# Patient Record
Sex: Female | Born: 2005
Health system: Southern US, Community
[De-identification: ages and names within clinical notes are randomized; demographics above are authoritative.]

## PROBLEM LIST (undated history)

## (undated) DIAGNOSIS — E278 Other specified disorders of adrenal gland: Secondary | ICD-10-CM

## (undated) DIAGNOSIS — R7989 Other specified abnormal findings of blood chemistry: Secondary | ICD-10-CM

## (undated) DIAGNOSIS — L680 Hirsutism: Secondary | ICD-10-CM

## (undated) HISTORY — DX: Hirsutism: L68.0

## (undated) HISTORY — DX: Other specified abnormal findings of blood chemistry: R79.89

## (undated) HISTORY — DX: Other specified disorders of adrenal gland: E27.8

---
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
## 2023-01-22 ENCOUNTER — Ambulatory Visit: Payer: Self-pay | Admitting: Family Medicine

## 2023-04-13 ENCOUNTER — Encounter: Payer: Self-pay | Admitting: Nurse Practitioner

## 2023-04-13 ENCOUNTER — Ambulatory Visit (INDEPENDENT_AMBULATORY_CARE_PROVIDER_SITE_OTHER): Payer: 59 | Admitting: Nurse Practitioner

## 2023-04-13 VITALS — BP 140/82 | HR 90 | Temp 98.7°F | Ht 67.0 in | Wt 198.2 lb

## 2023-04-13 DIAGNOSIS — E559 Vitamin D deficiency, unspecified: Secondary | ICD-10-CM | POA: Diagnosis not present

## 2023-04-13 DIAGNOSIS — R7303 Prediabetes: Secondary | ICD-10-CM

## 2023-04-13 DIAGNOSIS — Z6831 Body mass index (BMI) 31.0-31.9, adult: Secondary | ICD-10-CM

## 2023-04-13 DIAGNOSIS — E66811 Obesity, class 1: Secondary | ICD-10-CM

## 2023-04-13 DIAGNOSIS — E6609 Other obesity due to excess calories: Secondary | ICD-10-CM

## 2023-04-13 DIAGNOSIS — I10 Essential (primary) hypertension: Secondary | ICD-10-CM | POA: Diagnosis not present

## 2023-04-13 MED ORDER — HYDROCHLOROTHIAZIDE 12.5 MG PO CAPS
12.5000 mg | ORAL_CAPSULE | Freq: Every day | ORAL | 1 refills | Status: DC
Start: 2023-04-13 — End: 2023-04-14

## 2023-04-13 NOTE — Assessment & Plan Note (Addendum)
Chronic, blood pressure is elevated today, slightly improved with repeat, she has not taken her medication in 3 months. Reminded to take daily and the risk for long term effects related to poorly controlled, hypertension. Sent a new Rx to the pharmacy as the medications are in her dorm at [employer] [facility]. Continue current medications

## 2023-04-13 NOTE — Assessment & Plan Note (Signed)
Will check vitamin D level and supplement as needed.    Also encouraged to spend 15 minutes in the sun daily.   

## 2023-04-13 NOTE — Progress Notes (Signed)
[name], CMA,acting as a Neurosurgeon for [name], FNP.,have documented all relevant documentation on the behalf of [name], FNP,as directed by  [name], FNP while in the presence of [name], FNP.  Subjective:  Patient ID: Pamela Powers , female    DOB: [birth year] , 17 y.o.   MRN: [id number]  Chief Complaint  Patient presents with   Hypertension    HPI  Patient presents today for a bp and pre dm follow up, Patient reports non- compliance with medication. Patient denies any chest pain, SOB, or headaches. Patient has no concerns today. Patient reports she is not taking her hydrochlorothiazide in about 3 months because she forgot. She has left her medications in her dorm at [facility]     Past Medical History:  Diagnosis Date   Elevated DHEA    Elevated hemoglobin A1c 04/2020   5.8   Hirsutism 2020   elevated testosterone and DHEA-S     Family History  Problem Relation Age of Onset   Hypertension Mother    Hypertension Father    Thyroid disease Father        graves   Diabetes Maternal Grandmother    Hypertension Maternal Grandmother    Hypertension Maternal Grandfather    Diabetes Paternal Grandmother    Hypertension Paternal Grandmother    Diabetes Paternal Grandfather    Hypertension Paternal Grandfather      Current Outpatient Medications:    Magnesium 250 MG TABS, Take 1 tablet (250 mg total) by mouth every evening., Disp: 90 tablet, Rfl: 1   Vitamin D, Ergocalciferol, (DRISDOL) 1.25 MG (50000 UNIT) CAPS capsule, Take 1 capsule (50,000 Units total) by mouth every 7 (seven) days., Disp: 12 capsule, Rfl: 1   hydrochlorothiazide (MICROZIDE) 12.5 MG capsule, Take 1 capsule (12.5 mg total) by mouth daily., Disp: 30 capsule, Rfl: 1   No Known Allergies   Review of Systems  Constitutional: Negative.   Eyes: Negative.   Respiratory: Negative.    Cardiovascular: Negative.  Negative for chest pain and leg swelling.  Musculoskeletal: Negative.    Skin: Negative.   Neurological:  Negative for headaches.  Psychiatric/Behavioral: Negative.       Today's Vitals   04/13/23 0830 04/13/23 0849  BP: (!) 140/90 (!) 140/82  Pulse: 90   Temp: 98.7 F (37.1 C)   TempSrc: Oral   Weight: 198 lb 3.2 oz (89.9 kg)   Height: 5\' 7"  (1.702 m)   PainSc: 0-No pain    Body mass index is 31.04 kg/m.  Wt Readings from Last 3 Encounters:  04/13/23 198 lb 3.2 oz (89.9 kg) (97%, Z= 1.91)*  11/25/22 210 lb 3.2 oz (95.3 kg) (98%, Z= 2.09)*  11/11/22 213 lb (96.6 kg) (98%, Z= 2.12)*   * Growth percentiles are based on CDC (Girls, 2-20 Years) data.     Objective:  Physical Exam Vitals reviewed.  Constitutional:      General: She is not in acute distress.    Appearance: Normal appearance. She is obese.  Cardiovascular:     Rate and Rhythm: Normal rate and regular rhythm.     Pulses: Normal pulses.     Heart sounds: Normal heart sounds. No murmur heard. Pulmonary:     Effort: Pulmonary effort is normal. No respiratory distress.     Breath sounds: Normal breath sounds. No wheezing.  Musculoskeletal:        General: Normal range of motion.  Skin:    General: Skin is warm.  Neurological:  General: No focal deficit present.     Mental Status: She is alert and oriented to person, place, and time.     Cranial Nerves: No cranial nerve deficit.     Motor: No weakness.  Psychiatric:        Mood and Affect: Mood normal.        Behavior: Behavior normal.        Thought Content: Thought content normal.        Judgment: Judgment normal.         Assessment And Plan:  Essential hypertension Assessment & Plan: Chronic, blood pressure is elevated today, slightly improved with repeat, she has not taken her medication in 3 months. Reminded to take daily and the risk for long term effects related to poorly controlled, hypertension. Sent a new Rx to the pharmacy as the medications are in her dorm at [facility]. Continue current  medications  Orders: -     BMP8+eGFR -     hydroCHLOROthiazide; Take 1 capsule (12.5 mg total) by mouth daily.  Dispense: 30 capsule; Refill: 1  Prediabetes Assessment & Plan: HgbA1c has been normal at her last visit. Continue focusing on healthy diet and regular exercise.   Orders: -     Hemoglobin A1c  Class 1 obesity due to excess calories without serious comorbidity with body mass index (BMI) of 34.0 to 34.9 in adult  Vitamin D deficiency Assessment & Plan: Will check vitamin D level and supplement as needed.    Also encouraged to spend 15 minutes in the sun daily.    Orders: -     VITAMIN D 25 Hydroxy (Vit-D Deficiency, Fractures)  Class 1 drug-induced obesity with serious comorbidity and body mass index (BMI) of 31.0 to 31.9 in adult Assessment & Plan: She is encouraged to strive for BMI less than 30 to decrease cardiac risk. Advised to aim for at least 150 minutes of exercise per week. Congratulated on her 12 lb weight loss.       Return for 4 month bp check.  Patient was given opportunity to ask questions. Patient verbalized understanding of the plan and was able to repeat key elements of the plan. All questions were answered to their satisfaction.    Jeanell Sparrow, FNP, have reviewed all documentation for this visit. The documentation on 04/13/23 for the exam, diagnosis, procedures, and orders are all accurate and complete.   IF YOU HAVE BEEN REFERRED TO A SPECIALIST, IT MAY TAKE 1-2 WEEKS TO SCHEDULE/PROCESS THE REFERRAL. IF YOU HAVE NOT HEARD FROM US/SPECIALIST IN TWO WEEKS, PLEASE GIVE [place] A CALL AT [phone].

## 2023-04-13 NOTE — Assessment & Plan Note (Signed)
HgbA1c has been normal at her last visit. Continue focusing on healthy diet and regular exercise.

## 2023-04-13 NOTE — Assessment & Plan Note (Signed)
She is encouraged to strive for BMI less than 30 to decrease cardiac risk. Advised to aim for at least 150 minutes of exercise per week. Congratulated on her 12 lb weight loss.

## 2023-04-13 NOTE — Patient Instructions (Signed)
Congratulations on your 12 lb weight loss. Continue exercising regularly.

## 2023-04-14 ENCOUNTER — Other Ambulatory Visit: Payer: Self-pay

## 2023-04-14 DIAGNOSIS — I10 Essential (primary) hypertension: Secondary | ICD-10-CM

## 2023-04-14 LAB — HEMOGLOBIN A1C
Est. average glucose Bld gHb Est-mCnc: 111 mg/dL
Hgb A1c MFr Bld: 5.5 % (ref 4.8–5.6)

## 2023-04-14 LAB — BMP8+EGFR
BUN/Creatinine Ratio: 15 (ref 9–23)
BUN: 12 mg/dL (ref 6–20)
CO2: 21 mmol/L (ref 20–29)
Calcium: 9.4 mg/dL (ref 8.7–10.2)
Chloride: 103 mmol/L (ref 96–106)
Creatinine, Ser: 0.79 mg/dL (ref 0.57–1.00)
Glucose: 72 mg/dL (ref 70–99)
Potassium: 4.5 mmol/L (ref 3.5–5.2)
Sodium: 140 mmol/L (ref 134–144)
eGFR: 110 mL/min/{1.73_m2} (ref >59)

## 2023-04-14 LAB — VITAMIN D 25 HYDROXY (VIT D DEFICIENCY, FRACTURES): Vit D, 25-Hydroxy: 32.4 ng/mL (ref 30.0–100.0)

## 2023-04-14 MED ORDER — HYDROCHLOROTHIAZIDE 12.5 MG PO CAPS
12.5000 mg | ORAL_CAPSULE | Freq: Every day | ORAL | 1 refills | Status: DC
Start: 2023-04-14 — End: 2023-04-16

## 2023-04-16 ENCOUNTER — Other Ambulatory Visit: Payer: Self-pay

## 2023-04-16 ENCOUNTER — Encounter: Payer: Self-pay | Admitting: Nurse Practitioner

## 2023-04-16 DIAGNOSIS — I10 Essential (primary) hypertension: Secondary | ICD-10-CM

## 2023-04-16 MED ORDER — HYDROCHLOROTHIAZIDE 12.5 MG PO CAPS
12.5000 mg | ORAL_CAPSULE | Freq: Every day | ORAL | 0 refills | Status: DC
Start: 2023-04-16 — End: 2023-11-30

## 2023-04-16 MED ORDER — HYDROCHLOROTHIAZIDE 12.5 MG PO CAPS: 12.5000 mg | ORAL_CAPSULE | Freq: Every day | ORAL | 1 refills | Status: DC

## 2023-04-27 ENCOUNTER — Encounter: Payer: Self-pay | Admitting: Nurse Practitioner

## 2023-04-28 ENCOUNTER — Encounter: Payer: Self-pay | Admitting: Nurse Practitioner

## 2023-05-19 ENCOUNTER — Ambulatory Visit: Payer: 59 | Admitting: Nurse Practitioner

## 2023-05-19 ENCOUNTER — Encounter: Payer: Self-pay | Admitting: Nurse Practitioner

## 2023-05-19 VITALS — BP 130/80 | HR 88 | Temp 98.4°F | Ht 67.0 in | Wt 204.6 lb

## 2023-05-19 DIAGNOSIS — I10 Essential (primary) hypertension: Secondary | ICD-10-CM | POA: Diagnosis not present

## 2023-05-19 DIAGNOSIS — R7989 Other specified abnormal findings of blood chemistry: Secondary | ICD-10-CM

## 2023-05-19 DIAGNOSIS — E6609 Other obesity due to excess calories: Secondary | ICD-10-CM | POA: Diagnosis not present

## 2023-05-19 DIAGNOSIS — Z2821 Immunization not carried out because of patient refusal: Secondary | ICD-10-CM

## 2023-05-19 DIAGNOSIS — E66811 Obesity, class 1: Secondary | ICD-10-CM | POA: Diagnosis not present

## 2023-05-19 DIAGNOSIS — Z30011 Encounter for initial prescription of contraceptive pills: Secondary | ICD-10-CM

## 2023-05-19 DIAGNOSIS — Z6832 Body mass index (BMI) 32.0-32.9, adult: Secondary | ICD-10-CM

## 2023-05-19 MED ORDER — NORETHINDRONE 0.35 MG PO TABS: 1.0000 | ORAL_TABLET | Freq: Every day | ORAL | 11 refills | Status: AC

## 2023-05-19 NOTE — Patient Instructions (Addendum)
Micronor - It is best to begin taking this medication on the first day of your menstrual period. If you begin taking it on any other day, use a backup form of non-hormonal birth control (such as condoms, spermicide) for the first 48 hours to prevent pregnancy until the medication has enough time to work..  Abdominal pain (stomach) pain (severe) Chest Pain (sharp, crushing or heaviness)  Headaches sudden severe or vomiting, dizziness or fainting, weakness or numbness in arm or leg Eye Problems - blurring vision, flashing lights or partial/complete vision loss Sudden leg pain in calf or thigh or redness.

## 2023-05-19 NOTE — Progress Notes (Unsigned)
Pamela Powers, CMA,acting as a Neurosurgeon for Pamela Felts, FNP.,have documented all relevant documentation on the behalf of Pamela Felts, FNP,as directed by  Pamela Felts, FNP while in the presence of Pamela Felts, FNP.  Subjective:  Patient ID: Pamela Powers , female    DOB: 01-29-2006 , 18 y.o.   MRN: [id number]  No chief complaint on file.   HPI  Patient presents today for wanting DHEA levels checked. Patient reports this level was high when she was 12/15. Patient reports she is having more hair growth on her chin. Patient would like to start a birth control again. She stopped taking OCP in 2022. LMP December 29th. Menstrual cycles are regular. Patient reports she was seen by a endocrinologist previously.   Patient is with her mother Pamela Powers.   Wt Readings from Last 3 Encounters: 05/19/23 : 204 lb 9.6 oz (92.8 kg) (98%, Z= 2.01)* 04/13/23 : 198 lb 3.2 oz (89.9 kg) (97%, Z= 1.91)* 11/25/22 : 210 lb 3.2 oz (95.3 kg) (98%, Z= 2.09)*      Past Medical History:  Diagnosis Date   Class 1 obesity due to excess calories with body mass index (BMI) of 33.0 to 33.9 in adult 10/23/2022   Elevated DHEA    Elevated hemoglobin A1c 04/2020   5.8   Hirsutism 2020   elevated testosterone and DHEA-S     Family History  Problem Relation Age of Onset   Hypertension Mother    Hypertension Father    Thyroid disease Father        graves   Diabetes Maternal Grandmother    Hypertension Maternal Grandmother    Hypertension Maternal Grandfather    Diabetes Paternal Grandmother    Hypertension Paternal Grandmother    Diabetes Paternal Grandfather    Hypertension Paternal Grandfather      Current Outpatient Medications:    hydrochlorothiazide (MICROZIDE) 12.5 MG capsule, Take 1 capsule (12.5 mg total) by mouth daily., Disp: 90 capsule, Rfl: 0   Magnesium 250 MG TABS, Take 1 tablet (250 mg total) by mouth every evening., Disp: 90 tablet, Rfl: 1   norethindrone (MICRONOR) 0.35 MG tablet, Take 1  tablet (0.35 mg total) by mouth daily., Disp: 28 tablet, Rfl: 11   Vitamin D, Ergocalciferol, (DRISDOL) 1.25 MG (50000 UNIT) CAPS capsule, Take 1 capsule (50,000 Units total) by mouth every 7 (seven) days., Disp: 12 capsule, Rfl: 1   No Known Allergies   Review of Systems  Constitutional: Negative.   Respiratory: Negative.       Today's Vitals   05/19/23 1413  BP: 130/80  Pulse: 88  Temp: 98.4 F (36.9 C)  TempSrc: Oral  Weight: 204 lb 9.6 oz (92.8 kg)  Height: 5\' 7"  (1.702 m)  PainSc: 0-No pain   Body mass index is 32.04 kg/m.  Wt Readings from Last 3 Encounters:  05/19/23 204 lb 9.6 oz (92.8 kg) (98%, Z= 2.01)*  04/13/23 198 lb 3.2 oz (89.9 kg) (97%, Z= 1.91)*  11/25/22 210 lb 3.2 oz (95.3 kg) (98%, Z= 2.09)*   * Growth percentiles are based on CDC (Girls, 2-20 Years) data.   Objective:  Physical Exam Vitals reviewed.  Constitutional:      General: She is not in acute distress.    Appearance: Normal appearance. She is obese.  Cardiovascular:     Rate and Rhythm: Normal rate and regular rhythm.     Pulses: Normal pulses.     Heart sounds: Normal heart sounds. No murmur heard. Pulmonary:  Effort: Pulmonary effort is normal. No respiratory distress.     Breath sounds: Normal breath sounds. No wheezing.  Musculoskeletal:        General: Normal range of motion.  Skin:    General: Skin is warm.  Neurological:     General: No focal deficit present.     Mental Status: She is alert and oriented to person, place, and time.     Cranial Nerves: No cranial nerve deficit.     Motor: No weakness.  Psychiatric:        Mood and Affect: Mood normal.        Behavior: Behavior normal.        Thought Content: Thought content normal.        Judgment: Judgment normal.         Assessment And Plan:  Elevated DHEA Assessment & Plan: She has had an elevated DHEA in the past and seen by a pediatric endocrinologist would now like to see an adult Endocrinologist to see if any  additional testing needs to be done.   Orders: -     POCT urine pregnancy -     Ambulatory referral to Endocrinology  Essential hypertension Assessment & Plan: Blood pressure is controlled, continue current medications  Orders: -     Norethindrone; Take 1 tablet (0.35 mg total) by mouth daily.  Dispense: 28 tablet; Refill: 11  COVID-19 vaccination declined Assessment & Plan: Declines covid 19 vaccine. Discussed risk of covid 58 and if she changes her mind about the vaccine to call the office. Education has been provided regarding the importance of this vaccine but patient still declined. Advised may receive this vaccine at local pharmacy or Health Dept.or vaccine clinic. Aware to provide a copy of the vaccination record if obtained from local pharmacy or Health Dept.  Encouraged to take multivitamin, vitamin d, vitamin c and zinc to increase immune system. Aware can call office if would like to have vaccine here at office. Verbalized acceptance and understanding.    Class 1 obesity due to excess calories with body mass index (BMI) of 32.0 to 32.9 in adult, unspecified whether serious comorbidity present Assessment & Plan: She is encouraged to strive for BMI less than 30 to decrease cardiac risk. Advised to aim for at least 150 minutes of exercise per week.    Encounter for oral contraception initial prescription Assessment & Plan: Will start her on progesterone only birth control due to history of hypertension. Negative urine pregnancy  Orders: -     Norethindrone; Take 1 tablet (0.35 mg total) by mouth daily.  Dispense: 28 tablet; Refill: 11    Return for reschedule her May appt for when she is out of school .  Patient was given opportunity to ask questions. Patient verbalized understanding of the plan and was able to repeat key elements of the plan. All questions were answered to their satisfaction.   Pamela Sparrow, FNP, have reviewed all documentation for this visit. The  documentation on 05/19/23 for the exam, diagnosis, procedures, and orders are all accurate and complete.    IF YOU HAVE BEEN REFERRED TO A SPECIALIST, IT MAY TAKE 1-2 WEEKS TO SCHEDULE/PROCESS THE REFERRAL. IF YOU HAVE NOT HEARD FROM US/SPECIALIST IN TWO WEEKS, PLEASE GIVE [place] A CALL AT [phone].

## 2023-05-27 DIAGNOSIS — Z2821 Immunization not carried out because of patient refusal: Secondary | ICD-10-CM | POA: Insufficient documentation

## 2023-05-27 DIAGNOSIS — Z30011 Encounter for initial prescription of contraceptive pills: Secondary | ICD-10-CM | POA: Insufficient documentation

## 2023-05-27 DIAGNOSIS — E66811 Obesity, class 1: Secondary | ICD-10-CM | POA: Insufficient documentation

## 2023-05-27 NOTE — Assessment & Plan Note (Signed)

## 2023-05-27 NOTE — Assessment & Plan Note (Addendum)
Will start her on progesterone only birth control due to history of hypertension. Negative urine pregnancy

## 2023-05-27 NOTE — Assessment & Plan Note (Signed)
Blood pressure is controlled, continue current medications

## 2023-05-27 NOTE — Assessment & Plan Note (Signed)
She has had an elevated DHEA in the past and seen by a pediatric endocrinologist would now like to see an adult Endocrinologist to see if any additional testing needs to be done.

## 2023-05-27 NOTE — Assessment & Plan Note (Signed)
She is encouraged to strive for BMI less than 30 to decrease cardiac risk. Advised to aim for at least 150 minutes of exercise per week.

## 2023-05-28 LAB — POCT URINE PREGNANCY: Preg Test, Ur: NEGATIVE

## 2023-06-01 ENCOUNTER — Encounter: Payer: Self-pay | Admitting: Nurse Practitioner

## 2023-08-31 ENCOUNTER — Ambulatory Visit: Payer: 59 | Admitting: Nurse Practitioner

## 2023-09-07 ENCOUNTER — Ambulatory Visit (INDEPENDENT_AMBULATORY_CARE_PROVIDER_SITE_OTHER): Payer: 59 | Admitting: Nurse Practitioner

## 2023-09-07 ENCOUNTER — Encounter: Payer: Self-pay | Admitting: Nurse Practitioner

## 2023-09-07 VITALS — BP 130/96 | HR 85 | Temp 98.5°F | Ht 67.0 in | Wt 197.4 lb

## 2023-09-07 DIAGNOSIS — R7303 Prediabetes: Secondary | ICD-10-CM

## 2023-09-07 DIAGNOSIS — E6609 Other obesity due to excess calories: Secondary | ICD-10-CM

## 2023-09-07 DIAGNOSIS — E66811 Obesity, class 1: Secondary | ICD-10-CM | POA: Insufficient documentation

## 2023-09-07 DIAGNOSIS — Z6830 Body mass index (BMI) 30.0-30.9, adult: Secondary | ICD-10-CM

## 2023-09-07 DIAGNOSIS — I10 Essential (primary) hypertension: Secondary | ICD-10-CM | POA: Diagnosis not present

## 2023-09-07 DIAGNOSIS — Z2821 Immunization not carried out because of patient refusal: Secondary | ICD-10-CM

## 2023-09-07 MED ORDER — AMLODIPINE BESYLATE 2.5 MG PO TABS: 2.5000 mg | ORAL_TABLET | Freq: Every day | ORAL | 2 refills | Status: AC

## 2023-09-07 NOTE — Assessment & Plan Note (Signed)

## 2023-09-07 NOTE — Patient Instructions (Signed)
Congratulations on your weight loss, keep up the good work.

## 2023-09-07 NOTE — Progress Notes (Signed)
 [place], CMA,acting as a Neurosurgeon for Susanna Epley, FNP.,have documented all relevant documentation on the behalf of Susanna Epley, FNP,as directed by  Susanna Epley, FNP while in the presence of Susanna Epley, FNP.  Subjective:  Patient ID: Pamela Powers , female    DOB: 2005-05-07 , 18 y.o.   MRN: [id number]  Chief Complaint  Patient presents with   Hypertension    Patient presents today for a bp and pre dm follow up, Patient reports compliance with medication. Patient denies any chest pain, SOB, or headaches. Patient has no concerns today.     HPI  She feels like she may have missed two doses of her blood pressure medications last week. 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 Encouraged to make sure she stays well hydrated with water when working this summer      Past Medical History:  Diagnosis Date   Class 1 obesity due to excess calories with body mass index (BMI) of 33.0 to 33.9 in adult 10/23/2022   Elevated DHEA    Elevated hemoglobin A1c 04/2020   5.8   Hirsutism 2020   elevated testosterone  and DHEA-S     Family History  Problem Relation Age of Onset   Hypertension Mother    Hypertension Father    Thyroid  disease Father        graves   Diabetes Maternal Grandmother    Hypertension Maternal Grandmother    Hypertension Maternal Grandfather    Diabetes Paternal Grandmother    Hypertension Paternal Grandmother    Diabetes Paternal Grandfather    Hypertension Paternal Grandfather      Current Outpatient Medications:    amLODipine  (NORVASC ) 2.5 MG tablet, Take 1 tablet (2.5 mg total) by mouth daily., Disp: 90 tablet, Rfl: 2   hydrochlorothiazide  (MICROZIDE ) 12.5 MG capsule, Take 1 capsule (12.5 mg total) by mouth daily., Disp: 90 capsule, Rfl: 0   Magnesium  250 MG TABS, Take 1 tablet (250 mg total) by mouth every evening., Disp: 90 tablet, Rfl: 1   Vitamin D , Ergocalciferol , (DRISDOL ) 1.25 MG (50000 UNIT) CAPS capsule, Take 1 capsule  (50,000 Units total) by mouth every 7 (seven) days., Disp: 12 capsule, Rfl: 1   norethindrone  (MICRONOR ) 0.35 MG tablet, Take 1 tablet (0.35 mg total) by mouth daily. (Patient not taking: Reported on 09/07/2023), Disp: 28 tablet, Rfl: 11   No Known Allergies   Review of Systems  Constitutional: Negative.   Respiratory: Negative.    Cardiovascular: Negative.   Neurological: Negative.   Psychiatric/Behavioral: Negative.       Today's Vitals   09/07/23 1450 09/07/23 1510  BP: (!) 140/100 (!) 130/96  Pulse: 85   Temp: 98.5 F (36.9 C)   TempSrc: Oral   Weight: 197 lb 6.4 oz (89.5 kg)   Height: 5\' 7"  (1.702 m)   PainSc: 0-No pain    Body mass index is 30.92 kg/m.  Wt Readings from Last 3 Encounters:  09/07/23 197 lb 6.4 oz (89.5 kg)  05/19/23 204 lb 9.6 oz (92.8 kg) (98%, Z= 2.01)*  04/13/23 198 lb 3.2 oz (89.9 kg) (97%, Z= 1.91)*   * Growth percentiles are based on CDC (Girls, 2-20 Years) data.     Objective:  Physical Exam Vitals and nursing note reviewed.  Constitutional:      General: She is not in acute distress.    Appearance: Normal appearance. She is obese.  Cardiovascular:     Rate and Rhythm: Normal  rate and regular rhythm.     Pulses: Normal pulses.     Heart sounds: Normal heart sounds. No murmur heard. Pulmonary:     Effort: Pulmonary effort is normal. No respiratory distress.     Breath sounds: Normal breath sounds. No wheezing.  Musculoskeletal:        General: Normal range of motion.  Skin:    General: Skin is warm.  Neurological:     General: No focal deficit present.     Mental Status: She is alert and oriented to person, place, and time.     Cranial Nerves: No cranial nerve deficit.     Motor: No weakness.  Psychiatric:        Mood and Affect: Mood normal.        Behavior: Behavior normal.        Thought Content: Thought content normal.        Judgment: Judgment normal.         Assessment And Plan:  Poorly-controlled  hypertension Assessment & Plan: Blood pressure is elevated with hydrochlorothiazide  at 12.5 mg daily. Will add amlodipine  2.5 mg daily in the evening. This hopefully will be short term of having both medications. I am also checking a renal stenosis ultrasound due to poorly controlled hypertension and her age.   Orders: -     BMP8+eGFR -     amLODIPine  Besylate; Take 1 tablet (2.5 mg total) by mouth daily.  Dispense: 90 tablet; Refill: 2 -     US  RENAL ARTERY DUPLEX COMPLETE; Future -     TSH  Prediabetes Assessment & Plan: HgbA1c has been normal at her last visit. Continue focusing on healthy diet and regular exercise.   Orders: -     Hemoglobin A1c  COVID-19 vaccination declined Assessment & Plan: Declines covid 19 vaccine. Discussed risk of covid 82 and if she changes her mind about the vaccine to call the office. Education has been provided regarding the importance of this vaccine but patient still declined. Advised may receive this vaccine at local pharmacy or Health Dept.or vaccine clinic. Aware to provide a copy of the vaccination record if obtained from local pharmacy or Health Dept.  Encouraged to take multivitamin, vitamin d , vitamin c and zinc to increase immune system. Aware can call office if would like to have vaccine here at office. Verbalized acceptance and understanding.    Class 1 obesity due to excess calories with body mass index (BMI) of 30.0 to 30.9 in adult, unspecified whether serious comorbidity present Assessment & Plan: Congratulated her on her 7 lb weight loss, encouraged to continue focusing on healthy diet and regular exercise.     Return for keep same next..  Patient was given opportunity to ask questions. Patient verbalized understanding of the plan and was able to repeat key elements of the plan. All questions were answered to their satisfaction.    Inge Mangle, FNP, have reviewed all documentation for this visit. The documentation on 09/07/23 for  the exam, diagnosis, procedures, and orders are all accurate and complete.   IF YOU HAVE BEEN REFERRED TO A SPECIALIST, IT MAY TAKE 1-2 WEEKS TO SCHEDULE/PROCESS THE REFERRAL. IF YOU HAVE NOT HEARD FROM US /SPECIALIST IN TWO WEEKS, PLEASE GIVE US  A CALL AT [phone].

## 2023-09-07 NOTE — Assessment & Plan Note (Signed)
 Blood pressure is elevated with hydrochlorothiazide  at 12.5 mg daily. Will add amlodipine  2.5 mg daily in the evening. This hopefully will be short term of having both medications. I am also checking a renal stenosis ultrasound due to poorly controlled hypertension and her age.

## 2023-09-07 NOTE — Assessment & Plan Note (Signed)
 HgbA1c has been normal at her last visit. Continue focusing on healthy diet and regular exercise.

## 2023-09-07 NOTE — Assessment & Plan Note (Signed)
 Congratulated her on her 7 lb weight loss, encouraged to continue focusing on healthy diet and regular exercise.

## 2023-09-08 ENCOUNTER — Ambulatory Visit: Payer: Self-pay | Admitting: Nurse Practitioner

## 2023-09-08 LAB — BMP8+EGFR
BUN/Creatinine Ratio: 23 (ref 9–23)
BUN: 17 mg/dL (ref 6–20)
CO2: 20 mmol/L (ref 20–29)
Calcium: 9.7 mg/dL (ref 8.7–10.2)
Chloride: 101 mmol/L (ref 96–106)
Creatinine, Ser: 0.73 mg/dL (ref 0.57–1.00)
Glucose: 77 mg/dL (ref 70–99)
Potassium: 4.2 mmol/L (ref 3.5–5.2)
Sodium: 138 mmol/L (ref 134–144)
eGFR: 121 mL/min/{1.73_m2} (ref >59)

## 2023-09-08 LAB — TSH: TSH: 0.709 u[IU]/mL (ref 0.450–4.500)

## 2023-09-08 LAB — HEMOGLOBIN A1C
Est. average glucose Bld gHb Est-mCnc: 111 mg/dL
Hgb A1c MFr Bld: 5.5 % (ref 4.8–5.6)

## 2023-09-16 ENCOUNTER — Ambulatory Visit
Admission: RE | Admit: 2023-09-16 | Discharge: 2023-09-16 | Disposition: A | Discharge status: Home / Self Care | Source: Ambulatory Visit | Attending: Nurse Practitioner | Admitting: Nurse Practitioner

## 2023-09-16 DIAGNOSIS — I10 Essential (primary) hypertension: Secondary | ICD-10-CM

## 2023-10-05 ENCOUNTER — Ambulatory Visit (INDEPENDENT_AMBULATORY_CARE_PROVIDER_SITE_OTHER): Admitting: "Endocrinology

## 2023-10-05 ENCOUNTER — Encounter: Payer: Self-pay | Admitting: "Endocrinology

## 2023-10-05 VITALS — BP 134/80 | HR 95 | Ht 67.0 in | Wt 201.0 lb

## 2023-10-05 DIAGNOSIS — R7989 Other specified abnormal findings of blood chemistry: Secondary | ICD-10-CM

## 2023-10-05 DIAGNOSIS — L680 Hirsutism: Secondary | ICD-10-CM | POA: Diagnosis not present

## 2023-10-05 NOTE — Progress Notes (Signed)
 Outpatient Endocrinology Note [name], MD    Arne Bevel 2006/04/25 [id number]  Referring Provider: Susanna Epley, FNP Primary Care Provider: Susanna Epley, FNP Reason for consultation: Subjective   Assessment & Plan  Diagnoses and all orders for this visit:  High serum dehydroepiandrosterone (DHEA) -     DHEA-sulfate -     Testosterone  Total,Free,Bio, Males -     Lipid panel -     Cortisol  Hirsutism  Patient presents for evaluation of history of elevated DHEA sulfate with c/o chin hair requiring waxing/shaving Thick hair noted on the chin, bilateral thighs anteriorly/medially, below the umbilicus Baseline initial blood work between 2020-2022 reported high consistently high DHEA-sulfate, highest being 533 in 2020, with unremarkable 17 hydroxyprogesterone, androstenedione, mildly elevated total testosterone  with free within the normal range, LH more than FSH, and cortisol of 6.6, although was not done in 8 AM Patient does has right stretch marks around the waist as well as fuller supraclavicular fat pads 10/08/2018 CT ABDOMEN WITHOUT CONTRAST done by pediatric endocrinologist for hirsutism, evaluate for adrenal mass reported adrenal glands within normal limits. Ordered labs, patient open to spironolactone if needed Recommended sticking to waxing rather than shaving for chin hait   Return in about 3 months (around 01/05/2024) for visit and 8 am labs before next visit.   I have reviewed current medications, nurse's notes, allergies, vital signs, past medical and surgical history, family medical history, and social history for this encounter. Counseled patient on symptoms, examination findings, lab findings, imaging results, treatment decisions and monitoring and prognosis. The patient understood the recommendations and agrees with the treatment plan. All questions regarding treatment plan were fully answered.  [name], MD  10/05/23   History of Present  Illness HPI  Pamela Powers is a 18 y.o. year old female who presents for evaluation of history of elevated DHEA sulfate.  Seen by pediatric endocrinologist in the past, who did baseline blood work as well as ultrasound adrenal Reports history of elevated DHEA-S and coming here to check the levels Reports history of excess hair growth on chin since age 18 and was checked on DHEA-S testosterone  Menstrual cycle started at age 18 and has been regular since Takes care of chin hair by waxing and shaving  Component     Latest Ref Rng 06/14/2018 09/22/2018 05/16/2020  LH     mIU/mL 9.1     FSH     mIU/mL 5.3     Testosterone , Total, LC/MS     ng/dL 30.8     Testosterone  Free     Not Estab. pg/mL 8.3     DHEA-SO4     37 - 307 mcg/dL 657.8 (H)  469 (H)  629 (H)   17 HYDROXYPROGESTERONE     ng/dL 47     Cortisol     ug/dL 6.6     ANDROSTENEDIONE     46 - 238 ng/dL  85      52/84/1324 CT ABDOMEN WITHOUT CONTRAST  CLINICAL DATA: Hirsutism, evaluate for adrenal mass  Adrenals/Urinary Tract: Adrenal glands are within normal limits.   Physical Exam  BP 134/80   Pulse 95   Ht 5\' 7"  (1.702 m)   Wt 201 lb (91.2 kg)   LMP 08/14/2023   SpO2 99%   BMI 31.48 kg/m    Constitutional: well developed, well nourished, thick hair noted on the chin, bilateral thighs anteriorly/medially, below the umbilicus, white stretch marks noted around the waist, + supraclavicular fat pads  Head: normocephalic, atraumatic Eyes: sclera anicteric, no redness Neck: supple Lungs: normal respiratory effort Neurology: alert and oriented Skin: dry, no appreciable rashes Musculoskeletal: no appreciable defects Psychiatric: normal mood and affect   Current Medications Patient's Medications  New Prescriptions   No medications on file  Previous Medications   AMLODIPINE  (NORVASC ) 2.5 MG TABLET    Take 1 tablet (2.5 mg total) by mouth daily.   HYDROCHLOROTHIAZIDE  (MICROZIDE ) 12.5 MG CAPSULE    Take 1 capsule (12.5 mg  total) by mouth daily.   MAGNESIUM  250 MG TABS    Take 1 tablet (250 mg total) by mouth every evening.   NORETHINDRONE  (MICRONOR ) 0.35 MG TABLET    Take 1 tablet (0.35 mg total) by mouth daily.   VITAMIN D , ERGOCALCIFEROL , (DRISDOL ) 1.25 MG (50000 UNIT) CAPS CAPSULE    Take 1 capsule (50,000 Units total) by mouth every 7 (seven) days.  Modified Medications   No medications on file  Discontinued Medications   No medications on file    Allergies No Known Allergies  Past Medical History Past Medical History:  Diagnosis Date   Class 1 obesity due to excess calories with body mass index (BMI) of 33.0 to 33.9 in adult 10/23/2022   Elevated DHEA    Elevated hemoglobin A1c 04/2020   5.8   Hirsutism 2020   elevated testosterone  and DHEA-S    Past Surgical History History reviewed. No pertinent surgical history.  Family History family history includes Diabetes in her maternal grandmother, paternal grandfather, and paternal grandmother; Hypertension in her father, maternal grandfather, maternal grandmother, mother, paternal grandfather, and paternal grandmother; Thyroid  disease in her father.  Social History Social History   Socioeconomic History   Marital status: Single    Spouse name: Not on file   Number of children: Not on file   Years of education: Not on file   Highest education level: GED or equivalent  Occupational History   Not on file  Tobacco Use   Smoking status: Never   Smokeless tobacco: Never  Vaping Use   Vaping status: Never Used  Substance and Sexual Activity   Alcohol use: Not Currently   Drug use: Never   Sexual activity: Not on file  Other Topics Concern   Not on file  Social History Narrative   Lives at home with mom, and dad, attends [employer]    Social Drivers of Health   Financial Resource Strain: Low Risk  (04/09/2023)   Overall Financial Resource Strain (CARDIA)    Difficulty of Paying Living Expenses: Not hard at all  Food Insecurity: No  Food Insecurity (04/09/2023)   Hunger Vital Sign    Worried About Running Out of Food in the Last Year: Never true    Ran Out of Food in the Last Year: Never true  Transportation Needs: No Transportation Needs (04/09/2023)   PRAPARE - Administrator, Civil Service (Medical): No    Lack of Transportation (Non-Medical): No  Physical Activity: Sufficiently Active (04/09/2023)   Exercise Vital Sign    Days of Exercise per Week: 4 days    Minutes of Exercise per Session: 40 min  Stress: No Stress Concern Present (04/09/2023)   [employer] of Occupational Health - Occupational Stress Questionnaire    Feeling of Stress : Not at all  Social Connections: Moderately Isolated (04/09/2023)   Social Connection and Isolation Panel [NHANES]    Frequency of Communication with Friends and Family: Once a week    Frequency of  Social Gatherings with Friends and Family: More than three times a week    Attends Religious Services: Never    Database administrator or Organizations: Yes    Attends Banker Meetings: More than 4 times per year    Marital Status: Never married  Intimate Partner Violence: Not on file    Lab Results  Component Value Date   CHOL 142 11/25/2022   Lab Results  Component Value Date   HDL 38 (L) 11/25/2022   Lab Results  Component Value Date   LDLCALC 87 11/25/2022   Lab Results  Component Value Date   TRIG 90 (H) 11/25/2022   Lab Results  Component Value Date   CHOLHDL 3.7 11/25/2022   Lab Results  Component Value Date   CREATININE 0.73 09/07/2023   No results found for: "GFR"    Component Value Date/Time   NA 138 09/07/2023 1515   K 4.2 09/07/2023 1515   CL 101 09/07/2023 1515   CO2 20 09/07/2023 1515   GLUCOSE 77 09/07/2023 1515   BUN 17 09/07/2023 1515   CREATININE 0.73 09/07/2023 1515   CALCIUM 9.7 09/07/2023 1515   PROT 7.1 10/23/2022 0940   ALBUMIN 4.3 10/23/2022 0940   AST 10 10/23/2022 0940   ALT 9 10/23/2022 0940    ALKPHOS 105 10/23/2022 0940   BILITOT 0.8 10/23/2022 0940      Latest Ref Rng & Units 09/07/2023    3:15 PM 04/13/2023   10:43 AM 11/11/2022    8:52 AM  BMP  Glucose 70 - 99 mg/dL 77  72  84   BUN 6 - 20 mg/dL 17  12  16    Creatinine 0.57 - 1.00 mg/dL 6.21  3.08  6.57   BUN/Creat Ratio 9 - 23 23  15  21    Sodium 134 - 144 mmol/L 138  140  139   Potassium 3.5 - 5.2 mmol/L 4.2  4.5  4.8   Chloride 96 - 106 mmol/L 101  103  103   CO2 20 - 29 mmol/L 20  21  22    Calcium 8.7 - 10.2 mg/dL 9.7  9.4  9.8        Component Value Date/Time   WBC 6.3 11/25/2022 1034   RBC 4.25 11/25/2022 1034   HGB 11.5 11/25/2022 1034   HCT 35.0 11/25/2022 1034   PLT 300 11/25/2022 1034   MCV 82 11/25/2022 1034   MCH 27.1 11/25/2022 1034   MCHC 32.9 11/25/2022 1034   RDW 13.7 11/25/2022 1034   Lab Results  Component Value Date   TSH 0.709 09/07/2023   TSH 0.901 07/11/2022         Parts of this note may have been dictated using voice recognition software. There may be variances in spelling and vocabulary which are unintentional. Not all errors are proofread. Please notify the [facility] if any discrepancies are noted or if the meaning of any statement is not clear.

## 2023-10-06 ENCOUNTER — Ambulatory Visit: Payer: Self-pay

## 2023-10-06 VITALS — BP 120/80 | HR 82 | Temp 98.5°F | Ht 67.0 in | Wt 201.0 lb

## 2023-10-06 DIAGNOSIS — I10 Essential (primary) hypertension: Secondary | ICD-10-CM

## 2023-10-06 NOTE — Progress Notes (Signed)
 Patient is in office today for a nurse visit for Blood Pressure Check. Patient currently taking amLODipine  2.5mg  PM and Magnesium  250mg  PM. Patient blood pressure was 120 /80, Patient No chest pain, No shortness of breath, No dyspnea on exertion, No orthopnea, No paroxysmal nocturnal dyspnea, No edema, No palpitations, No syncope BP Readings from Last 3 Encounters:  10/06/23 120/80  10/05/23 134/80  09/07/23 (!) 130/96    Per provider- looks good continue with current medications, follow up next visit.

## 2023-10-08 LAB — CORTISOL: Cortisol, Plasma: 7.7 ug/dL

## 2023-10-08 LAB — CP TESTOSTERONE, BIO-FEMALE/CHILDREN
Albumin: 4.4 g/dL (ref 3.6–5.1)
Sex Hormone Binding: 30 nmol/L (ref 17–124)
TESTOSTERONE, BIOAVAILABLE: 6.8 ng/dL (ref 0.5–8.5)
Testosterone, Free: 3.4 pg/mL (ref 0.2–5.0)
Testosterone, Total, LC-MS-MS: 27 ng/dL (ref 2–45)

## 2023-10-08 LAB — LIPID PANEL
Cholesterol: 155 mg/dL (ref <200)
HDL: 51 mg/dL (ref >=50)
LDL Cholesterol (Calc): 90 mg/dL
Non-HDL Cholesterol (Calc): 104 mg/dL (ref <130)
Total CHOL/HDL Ratio: 3 (calc) (ref <5.0)
Triglycerides: 48 mg/dL (ref <150)

## 2023-10-08 LAB — DHEA-SULFATE: DHEA-SO4: 422 ug/dL — ABNORMAL HIGH (ref 44–286)

## 2023-11-23 ENCOUNTER — Encounter: Payer: Self-pay | Admitting: "Endocrinology

## 2023-11-23 ENCOUNTER — Ambulatory Visit (INDEPENDENT_AMBULATORY_CARE_PROVIDER_SITE_OTHER): Admitting: "Endocrinology

## 2023-11-23 VITALS — BP 124/80 | HR 101 | Ht 67.0 in | Wt 212.0 lb

## 2023-11-23 DIAGNOSIS — L680 Hirsutism: Secondary | ICD-10-CM

## 2023-11-23 DIAGNOSIS — R7989 Other specified abnormal findings of blood chemistry: Secondary | ICD-10-CM | POA: Diagnosis not present

## 2023-11-23 MED ORDER — DEXAMETHASONE 1 MG PO TABS: 1.0000 mg | ORAL_TABLET | Freq: Once | ORAL | 0 refills | Status: AC

## 2023-11-23 NOTE — Progress Notes (Signed)
 Outpatient Endocrinology Note Obadiah Birmingham, MD    Pamela Powers 15-Nov-2005 [id number]  Referring Provider: Georgina Speaks, FNP Primary Care Provider: Georgina Speaks, FNP Reason for consultation: Subjective   Assessment & Plan  Diagnoses and all orders for this visit:  High serum dehydroepiandrosterone (DHEA)  Hirsutism   Patient presents for evaluation of history of elevated DHEA sulfate with c/o chin hair requiring waxing/shaving Thick hair noted on the chin, bilateral thighs anteriorly/medially, below the umbilicus Baseline initial blood work between 2020-2022 reported high consistently high DHEA-sulfate, highest being 533 in 2020, with unremarkable 17 hydroxyprogesterone, androstenedione, mildly elevated total testosterone  with free within the normal range, LH more than FSH, and cortisol of 6.6, although was not done in 8 AM Patient does has right stretch marks around the waist as well as fuller supraclavicular fat pads 10/08/2018 CT ABDOMEN WITHOUT CONTRAST done by pediatric endocrinologist for hirsutism, evaluate for adrenal mass reported adrenal glands within normal limits. Ordered labs, testosterone  WNL so spironolactone is not an option Recommended sticking to waxing rather than shaving for chin hait  No follow-ups on file.   I have reviewed current medications, nurse's notes, allergies, vital signs, past medical and surgical history, family medical history, and social history for this encounter. Counseled patient on symptoms, examination findings, lab findings, imaging results, treatment decisions and monitoring and prognosis. The patient understood the recommendations and agrees with the treatment plan. All questions regarding treatment plan were fully answered.  Obadiah Birmingham, MD  11/23/23   History of Present Illness HPI  Pamela Powers is a 18 y.o. year old female who presents for evaluation of history of elevated DHEA sulfate.  No new complaints  Reports good  energy, no unintentional weight loss/nausea/vomiting/abdominal pain    Seen by pediatric endocrinologist in the past, who did baseline blood work as well as ultrasound adrenal History of elevated DHEA-S - continue monitoring  Reports history of excess hair growth on chin since age 18 and was checked on DHEA-S testosterone  Menstrual cycle started at age 16 and has been regular since Takes care of chin hair by waxing and shaving  Component     Latest Ref Rng 06/14/2018 09/22/2018 05/16/2020  LH     mIU/mL 9.1     FSH     mIU/mL 5.3     Testosterone , Total, LC/MS     ng/dL 52.7     Testosterone  Free     Not Estab. pg/mL 8.3     DHEA-SO4     37 - 307 mcg/dL 466.4 (H)  664 (H)  592 (H)   17 HYDROXYPROGESTERONE     ng/dL 47     Cortisol     ug/dL 6.6     ANDROSTENEDIONE     46 - 238 ng/dL  85      93/87/7979 CT ABDOMEN WITHOUT CONTRAST  CLINICAL DATA: Hirsutism, evaluate for adrenal mass  Adrenals/Urinary Tract: Adrenal glands are within normal limits.   Physical Exam  BP 124/80   Pulse (!) 101   Ht 5' 7 (1.702 m)   Wt 212 lb (96.2 kg)   SpO2 98%   BMI 33.20 kg/m    Constitutional: well developed, well nourished, thick hair noted on the chin, bilateral thighs anteriorly/medially, below the umbilicus, white stretch marks noted around the waist, + supraclavicular fat pads Head: normocephalic, atraumatic Eyes: sclera anicteric, no redness Neck: supple Lungs: normal respiratory effort Neurology: alert and oriented Skin: dry, no appreciable rashes Musculoskeletal: no appreciable defects Psychiatric: normal mood  and affect   Current Medications Patient's Medications  New Prescriptions   No medications on file  Previous Medications   AMLODIPINE  (NORVASC ) 2.5 MG TABLET    Take 1 tablet (2.5 mg total) by mouth daily.   HYDROCHLOROTHIAZIDE  (MICROZIDE ) 12.5 MG CAPSULE    Take 1 capsule (12.5 mg total) by mouth daily.   MAGNESIUM  250 MG TABS    Take 1 tablet (250 mg total) by  mouth every evening.   NORETHINDRONE  (MICRONOR ) 0.35 MG TABLET    Take 1 tablet (0.35 mg total) by mouth daily.   VITAMIN D , ERGOCALCIFEROL , (DRISDOL ) 1.25 MG (50000 UNIT) CAPS CAPSULE    Take 1 capsule (50,000 Units total) by mouth every 7 (seven) days.  Modified Medications   No medications on file  Discontinued Medications   No medications on file    Allergies No Known Allergies  Past Medical History Past Medical History:  Diagnosis Date   Class 1 obesity due to excess calories with body mass index (BMI) of 33.0 to 33.9 in adult 10/23/2022   Elevated DHEA    Elevated hemoglobin A1c 04/2020   5.8   Hirsutism 2020   elevated testosterone  and DHEA-S    Past Surgical History History reviewed. No pertinent surgical history.  Family History family history includes Diabetes in her maternal grandmother, paternal grandfather, and paternal grandmother; Hypertension in her father, maternal grandfather, maternal grandmother, mother, paternal grandfather, and paternal grandmother; Thyroid  disease in her father.  Social History Social History   Socioeconomic History   Marital status: Single    Spouse name: Not on file   Number of children: Not on file   Years of education: Not on file   Highest education level: GED or equivalent  Occupational History   Not on file  Tobacco Use   Smoking status: Never   Smokeless tobacco: Never  Vaping Use   Vaping status: Never Used  Substance and Sexual Activity   Alcohol use: Not Currently   Drug use: Never   Sexual activity: Not on file  Other Topics Concern   Not on file  Social History Narrative   Lives at home with mom, and dad, attends [employer]    Social Drivers of Health   Financial Resource Strain: Low Risk  (04/09/2023)   Overall Financial Resource Strain (CARDIA)    Difficulty of Paying Living Expenses: Not hard at all  Food Insecurity: No Food Insecurity (04/09/2023)   Hunger Vital Sign    Worried About Running Out of  Food in the Last Year: Never true    Ran Out of Food in the Last Year: Never true  Transportation Needs: No Transportation Needs (04/09/2023)   PRAPARE - Administrator, Civil Service (Medical): No    Lack of Transportation (Non-Medical): No  Physical Activity: Sufficiently Active (04/09/2023)   Exercise Vital Sign    Days of Exercise per Week: 4 days    Minutes of Exercise per Session: 40 min  Stress: No Stress Concern Present (04/09/2023)   [employer] of Occupational Health - Occupational Stress Questionnaire    Feeling of Stress : Not at all  Social Connections: Moderately Isolated (04/09/2023)   Social Connection and Isolation Panel    Frequency of Communication with Friends and Family: Once a week    Frequency of Social Gatherings with Friends and Family: More than three times a week    Attends Religious Services: Never    Database administrator or Organizations: Yes  Attends Club or Organization Meetings: More than 4 times per year    Marital Status: Never married  Intimate Partner Violence: Not on file    Lab Results  Component Value Date   CHOL 155 10/05/2023   Lab Results  Component Value Date   HDL 51 10/05/2023   Lab Results  Component Value Date   LDLCALC 90 10/05/2023   Lab Results  Component Value Date   TRIG 48 10/05/2023   Lab Results  Component Value Date   CHOLHDL 3.0 10/05/2023   Lab Results  Component Value Date   CREATININE 0.73 09/07/2023   No results found for: GFR    Component Value Date/Time   NA 138 09/07/2023 1515   K 4.2 09/07/2023 1515   CL 101 09/07/2023 1515   CO2 20 09/07/2023 1515   GLUCOSE 77 09/07/2023 1515   BUN 17 09/07/2023 1515   CREATININE 0.73 09/07/2023 1515   CALCIUM 9.7 09/07/2023 1515   PROT 7.1 10/23/2022 0940   ALBUMIN 4.3 10/23/2022 0940   AST 10 10/23/2022 0940   ALT 9 10/23/2022 0940   ALKPHOS 105 10/23/2022 0940   BILITOT 0.8 10/23/2022 0940      Latest Ref Rng & Units 09/07/2023     3:15 PM 04/13/2023   10:43 AM 11/11/2022    8:52 AM  BMP  Glucose 70 - 99 mg/dL 77  72  84   BUN 6 - 20 mg/dL 17  12  16    Creatinine 0.57 - 1.00 mg/dL 9.26  9.20  9.22   BUN/Creat Ratio 9 - 23 23  15  21    Sodium 134 - 144 mmol/L 138  140  139   Potassium 3.5 - 5.2 mmol/L 4.2  4.5  4.8   Chloride 96 - 106 mmol/L 101  103  103   CO2 20 - 29 mmol/L 20  21  22    Calcium 8.7 - 10.2 mg/dL 9.7  9.4  9.8        Component Value Date/Time   WBC 6.3 11/25/2022 1034   RBC 4.25 11/25/2022 1034   HGB 11.5 11/25/2022 1034   HCT 35.0 11/25/2022 1034   PLT 300 11/25/2022 1034   MCV 82 11/25/2022 1034   MCH 27.1 11/25/2022 1034   MCHC 32.9 11/25/2022 1034   RDW 13.7 11/25/2022 1034   Lab Results  Component Value Date   TSH 0.709 09/07/2023   TSH 0.901 07/11/2022         Parts of this note may have been dictated using voice recognition software. There may be variances in spelling and vocabulary which are unintentional. Not all errors are proofread. Please notify the [facility] if any discrepancies are noted or if the meaning of any statement is not clear.

## 2023-11-23 NOTE — Addendum Note (Signed)
 Addended by: Rodric Punch on: 11/23/2023 09:35 AM   Modules accepted: Orders

## 2023-11-24 ENCOUNTER — Other Ambulatory Visit

## 2023-11-30 ENCOUNTER — Ambulatory Visit: Payer: Self-pay | Admitting: Nurse Practitioner

## 2023-11-30 ENCOUNTER — Encounter: Payer: Self-pay | Admitting: Nurse Practitioner

## 2023-11-30 VITALS — BP 120/82 | HR 80 | Temp 98.8°F | Ht 67.0 in | Wt 214.2 lb

## 2023-11-30 DIAGNOSIS — E559 Vitamin D deficiency, unspecified: Secondary | ICD-10-CM

## 2023-11-30 DIAGNOSIS — Z0000 Encounter for general adult medical examination without abnormal findings: Secondary | ICD-10-CM

## 2023-11-30 DIAGNOSIS — E66811 Obesity, class 1: Secondary | ICD-10-CM

## 2023-11-30 DIAGNOSIS — R7303 Prediabetes: Secondary | ICD-10-CM

## 2023-11-30 DIAGNOSIS — Z1159 Encounter for screening for other viral diseases: Secondary | ICD-10-CM

## 2023-11-30 DIAGNOSIS — R1084 Generalized abdominal pain: Secondary | ICD-10-CM | POA: Diagnosis not present

## 2023-11-30 DIAGNOSIS — E6609 Other obesity due to excess calories: Secondary | ICD-10-CM

## 2023-11-30 DIAGNOSIS — R7989 Other specified abnormal findings of blood chemistry: Secondary | ICD-10-CM

## 2023-11-30 DIAGNOSIS — I10 Essential (primary) hypertension: Secondary | ICD-10-CM | POA: Diagnosis not present

## 2023-11-30 DIAGNOSIS — Z2821 Immunization not carried out because of patient refusal: Secondary | ICD-10-CM

## 2023-11-30 DIAGNOSIS — Z79899 Other long term (current) drug therapy: Secondary | ICD-10-CM

## 2023-11-30 DIAGNOSIS — Z6833 Body mass index (BMI) 33.0-33.9, adult: Secondary | ICD-10-CM

## 2023-11-30 NOTE — Assessment & Plan Note (Signed)
 Blood pressure is better, she is better controlled on amlodipine . She is no longer taking hydrochlorothiazide  Encouraged to increase her physical activity to at least 150 minutes per week.

## 2023-11-30 NOTE — Assessment & Plan Note (Signed)
 She winces when palpating her left mid to lower abdomen and slightly feels like a firm area to same side. Will check CT abdomen and pelvis due to family history of fibroids.

## 2023-11-30 NOTE — Progress Notes (Signed)
 LILLETTE Kristeen JINNY Gladis, CMA,acting as a Neurosurgeon for Pamela Ada, FNP.,have documented all relevant documentation on the behalf of Pamela Ada, FNP,as directed by  Pamela Ada, FNP while in the presence of Pamela Ada, FNP.  Subjective:    Patient ID: Pamela Powers , female    DOB: August 26, 2005 , 18 y.o.   MRN: [id number]  Chief Complaint  Patient presents with   Annual Exam    Patient presents today for HM, Patient reports compliance with medication. Patient denies any chest pain, SOB, or headaches. Patient has no concerns today.     HPI  Here for HM.      Past Medical History:  Diagnosis Date   Class 1 obesity due to excess calories with body mass index (BMI) of 33.0 to 33.9 in adult 10/23/2022   Elevated DHEA    Elevated hemoglobin A1c 04/2020   5.8   Hirsutism 2020   elevated testosterone  and DHEA-S     Family History  Problem Relation Age of Onset   Hypertension Mother    Hypertension Father    Thyroid  disease Father        graves   Diabetes Maternal Grandmother    Hypertension Maternal Grandmother    Hypertension Maternal Grandfather    Diabetes Paternal Grandmother    Hypertension Paternal Grandmother    Diabetes Paternal Grandfather    Hypertension Paternal Grandfather      Current Outpatient Medications:    amLODipine  (NORVASC ) 2.5 MG tablet, Take 1 tablet (2.5 mg total) by mouth daily., Disp: 90 tablet, Rfl: 2   Magnesium  250 MG TABS, Take 1 tablet (250 mg total) by mouth every evening., Disp: 90 tablet, Rfl: 1   norethindrone  (MICRONOR ) 0.35 MG tablet, Take 1 tablet (0.35 mg total) by mouth daily., Disp: 28 tablet, Rfl: 11   Vitamin D , Ergocalciferol , (DRISDOL ) 1.25 MG (50000 UNIT) CAPS capsule, Take 1 capsule (50,000 Units total) by mouth every 7 (seven) days., Disp: 12 capsule, Rfl: 1   No Known Allergies    The patient states she uses abstinence for birth control. Patient's last menstrual period was 10/19/2023. Negative for Dysmenorrhea and Negative for  Menorrhagia. Negative for: breast discharge, breast lump(s), breast pain and breast self exam. Associated symptoms include abnormal vaginal bleeding. Pertinent negatives include abnormal bleeding (hematology), anxiety, decreased libido, depression, difficulty falling sleep, dyspareunia, history of infertility, nocturia, sexual dysfunction, sleep disturbances, urinary incontinence, urinary urgency, vaginal discharge and vaginal itching. Diet regular. The patient states her exercise level is none. She will be returning to [facility] in a few weeks.   The patient's tobacco use is:  Social History   Tobacco Use  Smoking Status Never  Smokeless Tobacco Never   She has been exposed to passive smoke. The patient's alcohol use is:  Social History   Substance and Sexual Activity  Alcohol Use Not Currently    Review of Systems  Constitutional: Negative.   HENT: Negative.    Eyes: Negative.   Respiratory: Negative.    Cardiovascular: Negative.  Negative for chest pain.  Gastrointestinal: Negative.   Endocrine: Negative.   Genitourinary: Negative.   Musculoskeletal: Negative.   Skin: Negative.   Allergic/Immunologic: Negative.   Neurological: Negative.   Hematological: Negative.   Psychiatric/Behavioral: Negative.       Today's Vitals   11/30/23 0839  BP: 120/82  Pulse: 80  Temp: 98.8 F (37.1 C)  TempSrc: Oral  Weight: 214 lb 3.2 oz (97.2 kg)  Height: 5' 7 (1.702 m)  PainSc:  0-No pain   Body mass index is 33.55 kg/m.  Wt Readings from Last 3 Encounters:  11/30/23 214 lb 3.2 oz (97.2 kg)  11/23/23 212 lb (96.2 kg)  10/06/23 201 lb (91.2 kg)     Objective:  Physical Exam Vitals and nursing note reviewed.  Constitutional:      General: She is not in acute distress.    Appearance: Normal appearance. She is well-developed. She is obese.  HENT:     Head: Normocephalic and atraumatic.     Right Ear: Hearing, tympanic membrane, ear canal and external ear normal. There  is no impacted cerumen.     Left Ear: Hearing, tympanic membrane, ear canal and external ear normal. There is no impacted cerumen.     Nose: Nose normal.     Mouth/Throat:     Mouth: Mucous membranes are moist.  Eyes:     General: Lids are normal.     Extraocular Movements: Extraocular movements intact.     Conjunctiva/sclera: Conjunctivae normal.     Pupils: Pupils are equal, round, and reactive to light.     Funduscopic exam:    Right eye: No papilledema.        Left eye: No papilledema.  Neck:     Thyroid : No thyroid  mass.     Vascular: No carotid bruit.  Cardiovascular:     Rate and Rhythm: Normal rate and regular rhythm.     Pulses: Normal pulses.     Heart sounds: Normal heart sounds. No murmur heard. Pulmonary:     Effort: Pulmonary effort is normal. No respiratory distress.     Breath sounds: Normal breath sounds. No wheezing.  Chest:     Chest wall: No mass.  Breasts:    Tanner Score is 5.     Right: Normal. No mass or tenderness.     Left: Normal. No mass or tenderness.  Abdominal:     General: Abdomen is flat. Bowel sounds are normal. There is no distension.     Palpations: Abdomen is soft.     Tenderness: There is no abdominal tenderness.  Musculoskeletal:        General: No swelling or tenderness. Normal range of motion.     Cervical back: Full passive range of motion without pain, normal range of motion and neck supple.     Right lower leg: No edema.     Left lower leg: No edema.  Lymphadenopathy:     Upper Body:     Right upper body: No supraclavicular, axillary or pectoral adenopathy.     Left upper body: No supraclavicular, axillary or pectoral adenopathy.  Skin:    General: Skin is warm and dry.     Capillary Refill: Capillary refill takes less than 2 seconds.  Neurological:     General: No focal deficit present.     Mental Status: She is alert and oriented to person, place, and time.     Cranial Nerves: No cranial nerve deficit.     Sensory: No  sensory deficit.     Motor: No weakness.  Psychiatric:        Mood and Affect: Mood normal.        Behavior: Behavior normal.        Thought Content: Thought content normal.        Judgment: Judgment normal.      Assessment And Plan:     Encounter for annual health examination Assessment & Plan: Behavior modifications discussed and diet history  reviewed.   Pt will continue to exercise regularly and modify diet with low GI, plant based foods and decrease intake of processed foods.  Recommend intake of daily multivitamin, Vitamin D , and calcium.  Recommend self breast exam monthly for preventive screenings, as well as recommend immunizations that include influenza, TDAP. Will check [facility] for her meningitis vaccines    Class 1 obesity due to excess calories with body mass index (BMI) of 33.0 to 33.9 in adult, unspecified whether serious comorbidity present Assessment & Plan: She is encouraged to strive for BMI less than 30 to decrease cardiac risk. Advised to aim for at least 150 minutes of exercise per week.    COVID-19 vaccination declined Assessment & Plan: Declines covid 19 vaccine. Discussed risk of covid 22 and if she changes her mind about the vaccine to call the office. Education has been provided regarding the importance of this vaccine but patient still declined. Advised may receive this vaccine at local pharmacy or Health Dept.or vaccine clinic. Aware to provide a copy of the vaccination record if obtained from local pharmacy or Health Dept.  Encouraged to take multivitamin, vitamin d , vitamin c and zinc to increase immune system. Aware can call office if would like to have vaccine here at office. Verbalized acceptance and understanding.    Encounter for hepatitis C screening test for low risk patient Assessment & Plan: Will check Hepatitis C screening due to recent recommendations to screen all adults 18 years and older   Orders: -     Hepatitis C antibody  Essential  hypertension Assessment & Plan: Blood pressure is better, she is better controlled on amlodipine . She is no longer taking hydrochlorothiazide  Encouraged to increase her physical activity to at least 150 minutes per week.   Orders: -     EKG 12-Lead -     Microalbumin / creatinine urine ratio -     POCT URINALYSIS DIP (CLINITEK) -     CMP14+EGFR  Prediabetes Assessment & Plan: HgbA1c has been normal at her last visit. Continue focusing on healthy diet and regular exercise. Will recheck levels  Orders: -     Hemoglobin A1c  Vitamin D  deficiency Assessment & Plan: Will check vitamin D  level and supplement as needed.    Also encouraged to spend 15 minutes in the sun daily.    Orders: -     VITAMIN D  25 Hydroxy (Vit-D Deficiency, Fractures)  Other long term (current) drug therapy -     CBC with Differential/Platelet  Generalized abdominal pain Assessment & Plan: She winces when palpating her left mid to lower abdomen and slightly feels like a firm area to same side. Will check CT abdomen and pelvis due to family history of fibroids.  Orders: -     CT ABDOMEN PELVIS WO CONTRAST; Future  Elevated DHEA Assessment & Plan: Continue f/u with Dr. Dartha      Return for 1 year physical, uncontrolled bp 3-52months check. Patient was given opportunity to ask questions. Patient verbalized understanding of the plan and was able to repeat key elements of the plan. All questions were answered to their satisfaction.   Pamela Ada, FNP  I, Pamela Ada, FNP, have reviewed all documentation for this visit. The documentation on 11/30/23 for the exam, diagnosis, procedures, and orders are all accurate and complete.

## 2023-11-30 NOTE — Assessment & Plan Note (Signed)

## 2023-11-30 NOTE — Assessment & Plan Note (Signed)
 Continue f/u with Dr. Motwani

## 2023-11-30 NOTE — Assessment & Plan Note (Signed)
 HgbA1c has been normal at her last visit. Continue focusing on healthy diet and regular exercise. Will recheck levels

## 2023-11-30 NOTE — Assessment & Plan Note (Signed)
 Will check Hepatitis C screening due to recent recommendations to screen all adults 18 years and older

## 2023-11-30 NOTE — Assessment & Plan Note (Signed)
 Will check vitamin D level and supplement as needed.    Also encouraged to spend 15 minutes in the sun daily.

## 2023-11-30 NOTE — Assessment & Plan Note (Signed)
 She is encouraged to strive for BMI less than 30 to decrease cardiac risk. Advised to aim for at least 150 minutes of exercise per week.

## 2023-11-30 NOTE — Assessment & Plan Note (Signed)
 Behavior modifications discussed and diet history reviewed.   Pt will continue to exercise regularly and modify diet with low GI, plant based foods and decrease intake of processed foods.  Recommend intake of daily multivitamin, Vitamin D , and calcium.  Recommend self breast exam monthly for preventive screenings, as well as recommend immunizations that include influenza, TDAP. Will check [facility] for her meningitis vaccines

## 2023-12-01 LAB — CMP14+EGFR
ALT: 8 IU/L (ref 0–32)
AST: 9 IU/L (ref 0–40)
Albumin: 4.2 g/dL (ref 4.0–5.0)
Alkaline Phosphatase: 91 IU/L (ref 42–106)
BUN/Creatinine Ratio: 19 (ref 9–23)
BUN: 13 mg/dL (ref 6–20)
Bilirubin Total: 0.5 mg/dL (ref 0.0–1.2)
CO2: 19 mmol/L — ABNORMAL LOW (ref 20–29)
Calcium: 9.1 mg/dL (ref 8.7–10.2)
Chloride: 104 mmol/L (ref 96–106)
Creatinine, Ser: 0.68 mg/dL (ref 0.57–1.00)
Globulin, Total: 2.7 g/dL (ref 1.5–4.5)
Glucose: 77 mg/dL (ref 70–99)
Potassium: 4.4 mmol/L (ref 3.5–5.2)
Sodium: 137 mmol/L (ref 134–144)
Total Protein: 6.9 g/dL (ref 6.0–8.5)
eGFR: 128 mL/min/1.73 (ref >59)

## 2023-12-01 LAB — HEMOGLOBIN A1C
Est. average glucose Bld gHb Est-mCnc: 100 mg/dL
Hgb A1c MFr Bld: 5.1 % (ref 4.8–5.6)

## 2023-12-01 LAB — CBC WITH DIFFERENTIAL/PLATELET
Basophils Absolute: 0 x10E3/uL (ref 0.0–0.2)
Basos: 0 %
EOS (ABSOLUTE): 0.1 x10E3/uL (ref 0.0–0.4)
Eos: 1 %
Hematocrit: 34.5 % (ref 34.0–46.6)
Hemoglobin: 10.9 g/dL — ABNORMAL LOW (ref 11.1–15.9)
Immature Grans (Abs): 0 x10E3/uL (ref 0.0–0.1)
Immature Granulocytes: 0 %
Lymphocytes Absolute: 2.5 x10E3/uL (ref 0.7–3.1)
Lymphs: 32 %
MCH: 26.8 pg (ref 26.6–33.0)
MCHC: 31.6 g/dL (ref 31.5–35.7)
MCV: 85 fL (ref 79–97)
Monocytes Absolute: 0.7 x10E3/uL (ref 0.1–0.9)
Monocytes: 9 %
Neutrophils Absolute: 4.5 x10E3/uL (ref 1.4–7.0)
Neutrophils: 58 %
Platelets: 304 x10E3/uL (ref 150–450)
RBC: 4.07 x10E6/uL (ref 3.77–5.28)
RDW: 13.5 % (ref 11.7–15.4)
WBC: 7.8 x10E3/uL (ref 3.4–10.8)

## 2023-12-01 LAB — HEPATITIS C ANTIBODY: Hep C Virus Ab: NONREACTIVE

## 2023-12-01 LAB — VITAMIN D 25 HYDROXY (VIT D DEFICIENCY, FRACTURES): Vit D, 25-Hydroxy: 26.6 ng/mL — ABNORMAL LOW (ref 30.0–100.0)

## 2023-12-03 LAB — LIPID PANEL
Cholesterol: 147 mg/dL (ref <200)
HDL: 54 mg/dL (ref >=50)
LDL Cholesterol (Calc): 82 mg/dL
Non-HDL Cholesterol (Calc): 93 mg/dL (ref <130)
Total CHOL/HDL Ratio: 2.7 (calc) (ref <5.0)
Triglycerides: 39 mg/dL (ref <150)

## 2023-12-03 LAB — DEXAMETHASONE, BLOOD: Dexamethasone, Serum: 265 ng/dL

## 2023-12-03 LAB — ACTH: C206 ACTH: <5 pg/mL — ABNORMAL LOW (ref 6–50)

## 2023-12-03 LAB — CORTISOL: Cortisol, Plasma: 0.8 ug/dL — ABNORMAL LOW

## 2023-12-07 ENCOUNTER — Telehealth (INDEPENDENT_AMBULATORY_CARE_PROVIDER_SITE_OTHER): Admitting: "Endocrinology

## 2023-12-07 ENCOUNTER — Encounter: Payer: Self-pay | Admitting: "Endocrinology

## 2023-12-07 VITALS — Ht 67.0 in | Wt 214.0 lb

## 2023-12-07 DIAGNOSIS — R7989 Other specified abnormal findings of blood chemistry: Secondary | ICD-10-CM | POA: Diagnosis not present

## 2023-12-07 DIAGNOSIS — L680 Hirsutism: Secondary | ICD-10-CM | POA: Diagnosis not present

## 2023-12-07 NOTE — Progress Notes (Signed)
 The patient reports they are currently: Pamela Powers. I spent 6 minutes on the video with the patient on the date of service. I spent an additional 5 minutes on pre- and post-visit activities on the date of service.   The patient was physically located in [place]  or a state in which I am permitted to provide care. The patient and/or parent/guardian understood that s/he may incur co-pays and cost sharing, and agreed to the telemedicine visit. The visit was reasonable and appropriate under the circumstances given the patient's presentation at the time.  The patient and/or parent/guardian has been advised of the potential risks and limitations of this mode of treatment (including, but not limited to, the absence of in-person examination) and has agreed to be treated using telemedicine. The patient's/patient's family's questions regarding telemedicine have been answered.   The patient and/or parent/guardian has also been advised to contact their provider's office for worsening conditions, and seek emergency medical treatment and/or call 911 if the patient deems either necessary.    Outpatient Endocrinology Note Obadiah Birmingham, MD    Pamela Powers 05/21/2005 [id number]  Referring Provider: Georgina Speaks, FNP Primary Care Provider: Georgina Speaks, FNP Reason for consultation: Subjective   Assessment & Plan  Diagnoses and all orders for this visit:  High serum dehydroepiandrosterone (DHEA)  Hirsutism    Patient presents for evaluation of history of elevated DHEA sulfate with c/o chin hair requiring waxing/shaving Thick hair noted on the chin, bilateral thighs anteriorly/medially, below the umbilicus Baseline initial blood work between 2020-2022 reported high consistently high DHEA-sulfate, highest being 533 in 2020, with unremarkable 17 hydroxyprogesterone, androstenedione, mildly elevated total testosterone  with free within the normal range, LH more than FSH, and cortisol of 6.6, although was  not done in 8 AM Patient does has right stretch marks around the waist as well as fuller supraclavicular fat pads 10/08/2018 CT ABDOMEN WITHOUT CONTRAST done by pediatric endocrinologist for hirsutism, evaluate for adrenal mass reported adrenal glands within normal limits. Ordered labs, testosterone  WNL so spironolactone is not an option Recommended sticking to waxing rather than shaving for chin hair 12/08/23: 1mg  dexamethasone  suppression test WNL  Continue monitoring   Return in about 4 months (around 04/07/2024) for visit and 8 am labs before next visit.   I have reviewed current medications, nurse's notes, allergies, vital signs, past medical and surgical history, family medical history, and social history for this encounter. Counseled patient on symptoms, examination findings, lab findings, imaging results, treatment decisions and monitoring and prognosis. The patient understood the recommendations and agrees with the treatment plan. All questions regarding treatment plan were fully answered.  Obadiah Birmingham, MD  12/07/23   History of Present Illness HPI  Pamela Powers is a 18 y.o. year old female who presents for follow up of history of elevated DHEA sulfate.  No new complaints  Reports good energy, no unintentional weight loss/nausea/vomiting/abdominal pain    Seen by pediatric endocrinologist in the past, who did baseline blood work as well as ultrasound adrenal History of elevated DHEA-S Reports history of excess hair growth on chin since age 52 and was checked on DHEA-S testosterone  Menstrual cycle started at age 18 and has been regular since Takes care of chin hair by waxing and shaving, on stomach and upper thigh   Component     Latest Ref Rng 06/14/2018 09/22/2018 05/16/2020  LH     mIU/mL 9.1     FSH     mIU/mL 5.3     Testosterone , Total, LC/MS  ng/dL 52.7     Testosterone  Free     Not Estab. pg/mL 8.3     DHEA-SO4     37 - 307 mcg/dL 466.4 (H)  664 (H)  592 (H)    17 HYDROXYPROGESTERONE     ng/dL 47     Cortisol     ug/dL 6.6     ANDROSTENEDIONE     46 - 238 ng/dL  85      93/87/7979 CT ABDOMEN WITHOUT CONTRAST  CLINICAL DATA: Hirsutism, evaluate for adrenal mass  Adrenals/Urinary Tract: Adrenal glands are within normal limits.   Physical Exam  Ht 5' 7 (1.702 m)   Wt 214 lb (97.1 kg)   LMP 10/19/2023   BMI 33.52 kg/m    Constitutional: well developed, well nourished, thick hair noted on the chin, bilateral thighs anteriorly/medially, below the umbilicus, white stretch marks noted around the waist, + supraclavicular fat pads Head: normocephalic, atraumatic Eyes: sclera anicteric, no redness Neck: supple Lungs: normal respiratory effort Neurology: alert and oriented Skin: dry, no appreciable rashes Musculoskeletal: no appreciable defects Psychiatric: normal mood and affect   Current Medications Patient's Medications  New Prescriptions   No medications on file  Previous Medications   AMLODIPINE  (NORVASC ) 2.5 MG TABLET    Take 1 tablet (2.5 mg total) by mouth daily.   MAGNESIUM  250 MG TABS    Take 1 tablet (250 mg total) by mouth every evening.   NORETHINDRONE  (MICRONOR ) 0.35 MG TABLET    Take 1 tablet (0.35 mg total) by mouth daily.   VITAMIN D , ERGOCALCIFEROL , (DRISDOL ) 1.25 MG (50000 UNIT) CAPS CAPSULE    Take 1 capsule (50,000 Units total) by mouth every 7 (seven) days.  Modified Medications   No medications on file  Discontinued Medications   No medications on file    Allergies No Known Allergies  Past Medical History Past Medical History:  Diagnosis Date   Class 1 obesity due to excess calories with body mass index (BMI) of 33.0 to 33.9 in adult 10/23/2022   Elevated DHEA    Elevated hemoglobin A1c 04/2020   5.8   Hirsutism 2020   elevated testosterone  and DHEA-S    Past Surgical History History reviewed. No pertinent surgical history.  Family History family history includes Diabetes in her maternal  grandmother, paternal grandfather, and paternal grandmother; Hypertension in her father, maternal grandfather, maternal grandmother, mother, paternal grandfather, and paternal grandmother; Thyroid  disease in her father.  Social History Social History   Socioeconomic History   Marital status: Single    Spouse name: Not on file   Number of children: Not on file   Years of education: Not on file   Highest education level: GED or equivalent  Occupational History   Not on file  Tobacco Use   Smoking status: Never   Smokeless tobacco: Never  Vaping Use   Vaping status: Never Used  Substance and Sexual Activity   Alcohol use: Not Currently   Drug use: Never   Sexual activity: Never    Partners: Male    Birth control/protection: None  Other Topics Concern   Not on file  Social History Narrative   Lives at home with mom, and dad, attends [employer]    Social Drivers of Health   Financial Resource Strain: Low Risk  (04/09/2023)   Overall Financial Resource Strain (CARDIA)    Difficulty of Paying Living Expenses: Not hard at all  Food Insecurity: No Food Insecurity (04/09/2023)   Hunger Vital Sign  Worried About Programme researcher, broadcasting/film/video in the Last Year: Never true    Ran Out of Food in the Last Year: Never true  Transportation Needs: No Transportation Needs (04/09/2023)   PRAPARE - Administrator, Civil Service (Medical): No    Lack of Transportation (Non-Medical): No  Physical Activity: Sufficiently Active (04/09/2023)   Exercise Vital Sign    Days of Exercise per Week: 4 days    Minutes of Exercise per Session: 40 min  Stress: No Stress Concern Present (04/09/2023)   [employer] of Occupational Health - Occupational Stress Questionnaire    Feeling of Stress : Not at all  Social Connections: Moderately Isolated (04/09/2023)   Social Connection and Isolation Panel    Frequency of Communication with Friends and Family: Once a week    Frequency of Social  Gatherings with Friends and Family: More than three times a week    Attends Religious Services: Never    Database administrator or Organizations: Yes    Attends Banker Meetings: More than 4 times per year    Marital Status: Never married  Intimate Partner Violence: Not on file    Lab Results  Component Value Date   CHOL 147 11/24/2023   Lab Results  Component Value Date   HDL 54 11/24/2023   Lab Results  Component Value Date   LDLCALC 82 11/24/2023   Lab Results  Component Value Date   TRIG 39 11/24/2023   Lab Results  Component Value Date   CHOLHDL 2.7 11/24/2023   Lab Results  Component Value Date   CREATININE 0.68 11/30/2023   No results found for: GFR    Component Value Date/Time   NA 137 11/30/2023 0954   K 4.4 11/30/2023 0954   CL 104 11/30/2023 0954   CO2 19 (L) 11/30/2023 0954   GLUCOSE 77 11/30/2023 0954   BUN 13 11/30/2023 0954   CREATININE 0.68 11/30/2023 0954   CALCIUM 9.1 11/30/2023 0954   PROT 6.9 11/30/2023 0954   ALBUMIN 4.2 11/30/2023 0954   AST 9 11/30/2023 0954   ALT 8 11/30/2023 0954   ALKPHOS 91 11/30/2023 0954   BILITOT 0.5 11/30/2023 0954      Latest Ref Rng & Units 11/30/2023    9:54 AM 09/07/2023    3:15 PM 04/13/2023   10:43 AM  BMP  Glucose 70 - 99 mg/dL 77  77  72   BUN 6 - 20 mg/dL 13  17  12    Creatinine 0.57 - 1.00 mg/dL 9.31  9.26  9.20   BUN/Creat Ratio 9 - 23 19  23  15    Sodium 134 - 144 mmol/L 137  138  140   Potassium 3.5 - 5.2 mmol/L 4.4  4.2  4.5   Chloride 96 - 106 mmol/L 104  101  103   CO2 20 - 29 mmol/L 19  20  21    Calcium 8.7 - 10.2 mg/dL 9.1  9.7  9.4        Component Value Date/Time   WBC 7.8 11/30/2023 0954   RBC 4.07 11/30/2023 0954   HGB 10.9 (L) 11/30/2023 0954   HCT 34.5 11/30/2023 0954   PLT 304 11/30/2023 0954   MCV 85 11/30/2023 0954   MCH 26.8 11/30/2023 0954   MCHC 31.6 11/30/2023 0954   RDW 13.5 11/30/2023 0954   LYMPHSABS 2.5 11/30/2023 0954   EOSABS 0.1 11/30/2023  0954   BASOSABS 0.0 11/30/2023 0954  Lab Results  Component Value Date   TSH 0.709 09/07/2023   TSH 0.901 07/11/2022         Parts of this note may have been dictated using voice recognition software. There may be variances in spelling and vocabulary which are unintentional. Not all errors are proofread. Please notify the dino if any discrepancies are noted or if the meaning of any statement is not clear.

## 2023-12-09 ENCOUNTER — Ambulatory Visit
Admission: RE | Admit: 2023-12-09 | Discharge: 2023-12-09 | Disposition: A | Discharge status: Home / Self Care | Source: Ambulatory Visit | Attending: Nurse Practitioner | Admitting: Nurse Practitioner

## 2023-12-09 DIAGNOSIS — R1084 Generalized abdominal pain: Secondary | ICD-10-CM

## 2023-12-14 ENCOUNTER — Ambulatory Visit: Payer: Self-pay | Admitting: Nurse Practitioner

## 2023-12-14 DIAGNOSIS — E559 Vitamin D deficiency, unspecified: Secondary | ICD-10-CM

## 2023-12-14 MED ORDER — VITAMIN D (ERGOCALCIFEROL) 1.25 MG (50000 UNIT) PO CAPS: 50000.0000 [IU] | ORAL_CAPSULE | ORAL | 1 refills | Status: AC

## 2024-01-06 ENCOUNTER — Ambulatory Visit: Admitting: "Endocrinology

## 2024-04-11 ENCOUNTER — Ambulatory Visit: Payer: Self-pay | Admitting: Nurse Practitioner

## 2024-11-30 ENCOUNTER — Encounter: Payer: Self-pay | Admitting: Nurse Practitioner
